# Patient Record
Sex: Female | Born: 1935 | Race: White | Hispanic: No
Health system: Southern US, Community
[De-identification: ages and names within clinical notes are randomized; demographics above are authoritative.]

---

## 2004-02-16 ENCOUNTER — Ambulatory Visit: Payer: Self-pay | Admitting: Family Medicine

## 2004-09-18 ENCOUNTER — Other Ambulatory Visit: Payer: Self-pay

## 2004-10-09 ENCOUNTER — Inpatient Hospital Stay: Payer: Self-pay | Admitting: General Practice

## 2004-10-09 ENCOUNTER — Other Ambulatory Visit: Payer: Self-pay

## 2005-11-22 ENCOUNTER — Ambulatory Visit: Payer: Self-pay | Admitting: Family Medicine

## 2007-01-15 ENCOUNTER — Ambulatory Visit: Payer: Self-pay | Admitting: Family Medicine

## 2009-01-17 ENCOUNTER — Emergency Department: Payer: Self-pay | Admitting: Emergency Medicine

## 2010-04-25 ENCOUNTER — Ambulatory Visit: Payer: Self-pay | Admitting: Family Medicine

## 2011-05-11 ENCOUNTER — Ambulatory Visit: Payer: Self-pay | Admitting: Gastroenterology

## 2011-05-24 ENCOUNTER — Ambulatory Visit: Payer: Self-pay | Admitting: Gastroenterology

## 2011-06-01 ENCOUNTER — Ambulatory Visit: Payer: Self-pay | Admitting: Family Medicine

## 2013-01-09 ENCOUNTER — Emergency Department: Payer: Self-pay | Admitting: Emergency Medicine

## 2013-01-09 LAB — COMPREHENSIVE METABOLIC PANEL
Alkaline Phosphatase: 109 U/L (ref 50–136)
Anion Gap: 5 — ABNORMAL LOW (ref 7–16)
BUN: 18 mg/dL (ref 7–18)
Calcium, Total: 8.7 mg/dL (ref 8.5–10.1)
EGFR (African American): >60
EGFR (Non-African Amer.): 58 — ABNORMAL LOW
Osmolality: 250 (ref 275–301)
Potassium: 4.1 mmol/L (ref 3.5–5.1)
SGOT(AST): 41 U/L — ABNORMAL HIGH (ref 15–37)
SGPT (ALT): 33 U/L (ref 12–78)
Sodium: 122 mmol/L — ABNORMAL LOW (ref 136–145)
Total Protein: 7.2 g/dL (ref 6.4–8.2)

## 2013-01-09 LAB — CBC
HCT: 33 % — ABNORMAL LOW (ref 35.0–47.0)
HGB: 11.6 g/dL — ABNORMAL LOW (ref 12.0–16.0)
MCV: 97 fL (ref 80–100)
WBC: 9.2 10*3/uL (ref 3.6–11.0)

## 2013-01-09 LAB — URINALYSIS, COMPLETE
Bilirubin,UR: NEGATIVE
Blood: NEGATIVE
Glucose,UR: NEGATIVE mg/dL (ref 0–75)
Ketone: NEGATIVE
Nitrite: NEGATIVE
Ph: 7 (ref 4.5–8.0)
RBC,UR: 58 /HPF (ref 0–5)
Specific Gravity: 1.01 (ref 1.003–1.030)
WBC UR: 1003 /HPF (ref 0–5)

## 2013-01-09 LAB — PRO B NATRIURETIC PEPTIDE: B-Type Natriuretic Peptide: 305 pg/mL (ref 0–450)

## 2013-01-09 LAB — LIPASE, BLOOD: Lipase: 148 U/L (ref 73–393)

## 2013-01-09 LAB — TROPONIN I: Troponin-I: <0.02 ng/mL

## 2013-01-09 LAB — CK TOTAL AND CKMB (NOT AT ARMC): CK-MB: 8.6 ng/mL — ABNORMAL HIGH (ref 0.5–3.6)

## 2013-03-05 ENCOUNTER — Ambulatory Visit: Payer: Self-pay | Admitting: Gastroenterology

## 2013-03-05 ENCOUNTER — Encounter: Payer: Self-pay | Admitting: *Deleted

## 2013-03-11 ENCOUNTER — Ambulatory Visit: Payer: Self-pay | Admitting: General Surgery

## 2013-03-17 ENCOUNTER — Ambulatory Visit: Payer: Self-pay | Admitting: General Surgery

## 2013-07-21 ENCOUNTER — Inpatient Hospital Stay: Payer: Self-pay | Admitting: Internal Medicine

## 2013-07-21 LAB — BASIC METABOLIC PANEL
Anion Gap: 8 (ref 7–16)
BUN: 65 mg/dL — ABNORMAL HIGH (ref 7–18)
Calcium, Total: 9.1 mg/dL (ref 8.5–10.1)
Chloride: 103 mmol/L (ref 98–107)
Co2: 27 mmol/L (ref 21–32)
Creatinine: 1.44 mg/dL — ABNORMAL HIGH (ref 0.60–1.30)
EGFR (African American): 36 — ABNORMAL LOW
GFR CALC NON AF AMER: 31 — AB
GLUCOSE: 103 mg/dL — AB (ref 65–99)
Osmolality: 295 (ref 275–301)
POTASSIUM: 4.7 mmol/L (ref 3.5–5.1)
SODIUM: 138 mmol/L (ref 136–145)

## 2013-07-21 LAB — URINALYSIS, COMPLETE
BILIRUBIN, UR: NEGATIVE
Glucose,UR: NEGATIVE mg/dL (ref 0–75)
Hyaline Cast: 2
Ketone: NEGATIVE
NITRITE: NEGATIVE
PROTEIN: NEGATIVE
Ph: 5 (ref 4.5–8.0)
RBC,UR: 3 /HPF (ref 0–5)
SPECIFIC GRAVITY: 1.015 (ref 1.003–1.030)
Squamous Epithelial: 5
WBC UR: 117 /HPF (ref 0–5)

## 2013-07-21 LAB — TROPONIN I: Troponin-I: <0.02 ng/mL

## 2013-07-21 LAB — CBC
HCT: 35.4 % (ref 35.0–47.0)
HGB: 11.6 g/dL — ABNORMAL LOW (ref 12.0–16.0)
MCH: 32.6 pg (ref 26.0–34.0)
MCHC: 32.6 g/dL (ref 32.0–36.0)
MCV: 100 fL (ref 80–100)
Platelet: 245 10*3/uL (ref 150–440)
RBC: 3.54 10*6/uL — ABNORMAL LOW (ref 3.80–5.20)
RDW: 15.8 % — ABNORMAL HIGH (ref 11.5–14.5)
WBC: 10.5 10*3/uL (ref 3.6–11.0)

## 2013-07-21 LAB — PRO B NATRIURETIC PEPTIDE: B-Type Natriuretic Peptide: 134 pg/mL (ref 0–450)

## 2013-07-22 LAB — BASIC METABOLIC PANEL
Anion Gap: 5 — ABNORMAL LOW (ref 7–16)
BUN: 38 mg/dL — ABNORMAL HIGH (ref 7–18)
CO2: 28 mmol/L (ref 21–32)
CREATININE: 1.02 mg/dL (ref 0.60–1.30)
Calcium, Total: 8.8 mg/dL (ref 8.5–10.1)
Chloride: 107 mmol/L (ref 98–107)
EGFR (African American): >60
Glucose: 86 mg/dL (ref 65–99)
OSMOLALITY: 288 (ref 275–301)
Potassium: 3.8 mmol/L (ref 3.5–5.1)
SODIUM: 140 mmol/L (ref 136–145)

## 2013-07-22 LAB — CBC WITH DIFFERENTIAL/PLATELET
BASOS PCT: 0.4 %
Basophil #: 0.1 10*3/uL (ref 0.0–0.1)
Eosinophil #: 0.2 10*3/uL (ref 0.0–0.7)
Eosinophil %: 1.4 %
HCT: 29.1 % — AB (ref 35.0–47.0)
HGB: 9.4 g/dL — ABNORMAL LOW (ref 12.0–16.0)
Lymphocyte #: 0.8 10*3/uL — ABNORMAL LOW (ref 1.0–3.6)
Lymphocyte %: 6.2 %
MCH: 32.5 pg (ref 26.0–34.0)
MCHC: 32.3 g/dL (ref 32.0–36.0)
MCV: 101 fL — AB (ref 80–100)
MONOS PCT: 5.2 %
Monocyte #: 0.7 x10 3/mm (ref 0.2–0.9)
NEUTROS PCT: 86.8 %
Neutrophil #: 11 10*3/uL — ABNORMAL HIGH (ref 1.4–6.5)
Platelet: 198 10*3/uL (ref 150–440)
RBC: 2.89 10*6/uL — ABNORMAL LOW (ref 3.80–5.20)
RDW: 16.1 % — ABNORMAL HIGH (ref 11.5–14.5)
WBC: 12.7 10*3/uL — AB (ref 3.6–11.0)

## 2013-07-24 ENCOUNTER — Ambulatory Visit: Payer: Self-pay | Admitting: Internal Medicine

## 2013-07-24 LAB — CREATININE, SERUM
Creatinine: 0.94 mg/dL (ref 0.60–1.30)
EGFR (African American): >60
EGFR (Non-African Amer.): 52 — ABNORMAL LOW

## 2013-07-24 LAB — CBC WITH DIFFERENTIAL/PLATELET
BASOS PCT: 0.2 %
Basophil #: 0 10*3/uL (ref 0.0–0.1)
EOS ABS: 0 10*3/uL (ref 0.0–0.7)
Eosinophil %: 0.3 %
HCT: 31.5 % — ABNORMAL LOW (ref 35.0–47.0)
HGB: 10.1 g/dL — AB (ref 12.0–16.0)
Lymphocyte #: 1.1 10*3/uL (ref 1.0–3.6)
Lymphocyte %: 8.5 %
MCH: 32 pg (ref 26.0–34.0)
MCHC: 32.2 g/dL (ref 32.0–36.0)
MCV: 100 fL (ref 80–100)
MONO ABS: 1.3 x10 3/mm — AB (ref 0.2–0.9)
Monocyte %: 10.1 %
NEUTROS ABS: 10.5 10*3/uL — AB (ref 1.4–6.5)
NEUTROS PCT: 80.9 %
Platelet: 199 10*3/uL (ref 150–440)
RBC: 3.16 10*6/uL — AB (ref 3.80–5.20)
RDW: 15.3 % — AB (ref 11.5–14.5)
WBC: 13 10*3/uL — AB (ref 3.6–11.0)

## 2013-07-24 LAB — VANCOMYCIN, TROUGH: Vancomycin, Trough: 11 ug/mL (ref 10–20)

## 2013-07-26 LAB — CULTURE, BLOOD (SINGLE)

## 2013-08-24 ENCOUNTER — Ambulatory Visit: Payer: Self-pay | Admitting: Internal Medicine

## 2013-11-09 IMAGING — CT CT ABD-PELV W/ CM
1 of 3 series · 12 of 32 positions shown, 18 images · non-contrast
Comparison: none

REASON FOR EXAM: (1) abd fullness and distention IV contrast ONLY; (2)
abd fullness and distensio
COMMENTS:   May transport without cardiac monitor

[Series 3: soft tissue · axial · 0.64mm/px · z∈[-896,-552]mm · 12 of 137 slices shown, 18 images]
[im 11/137  soft-tissue]
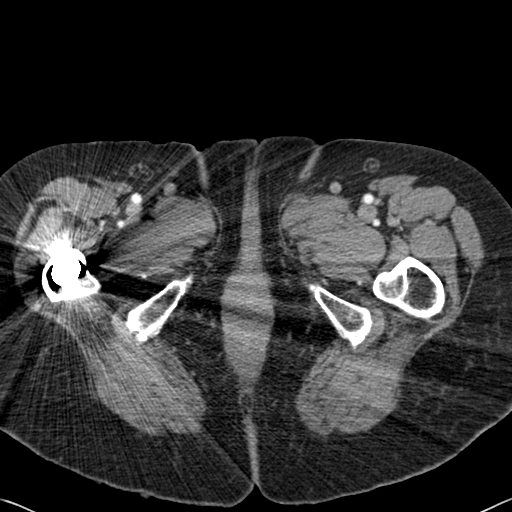
[im 11/137  bone]
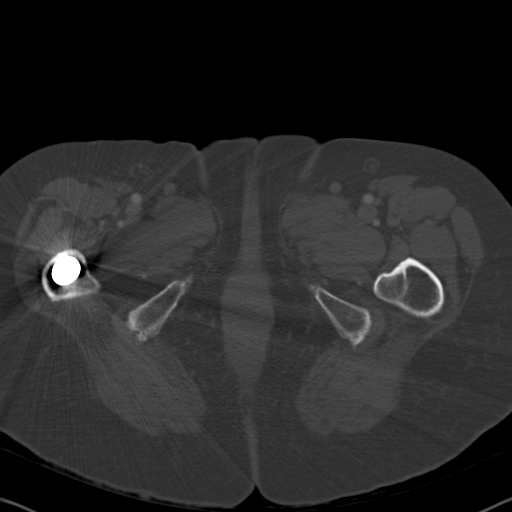
[im 21/137  soft-tissue]
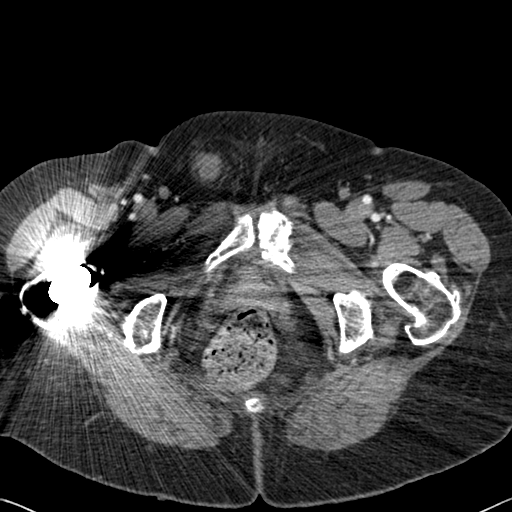
[im 32/137  soft-tissue]
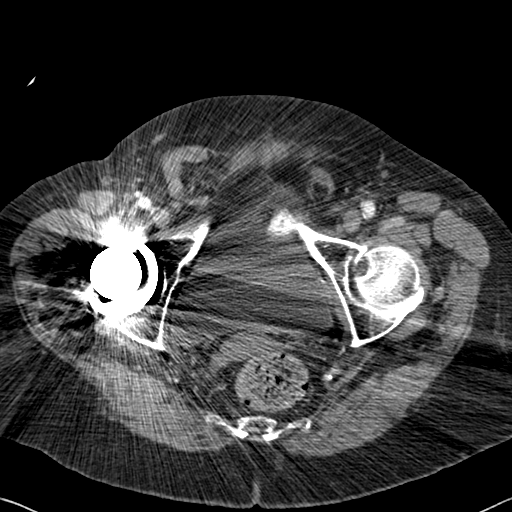
[im 42/137  soft-tissue]
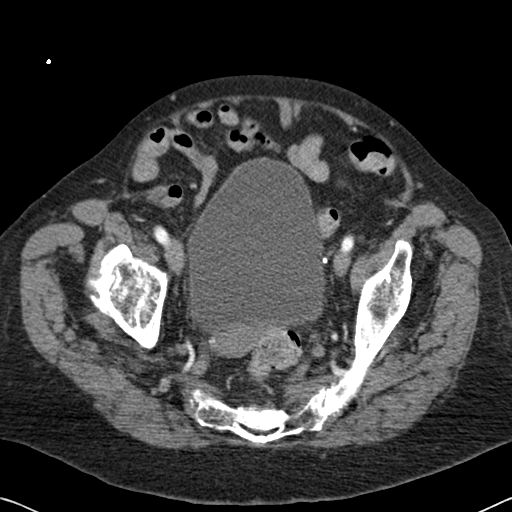
[im 53/137  soft-tissue]
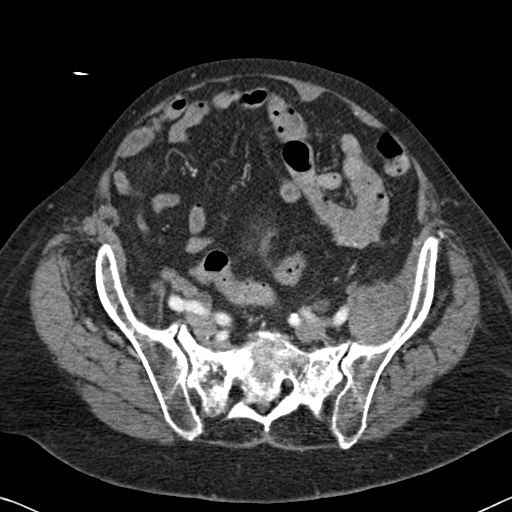
[im 63/137  soft-tissue]
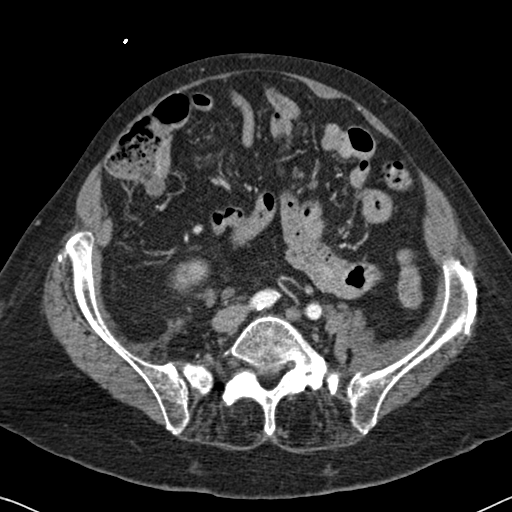
[im 74/137  soft-tissue]
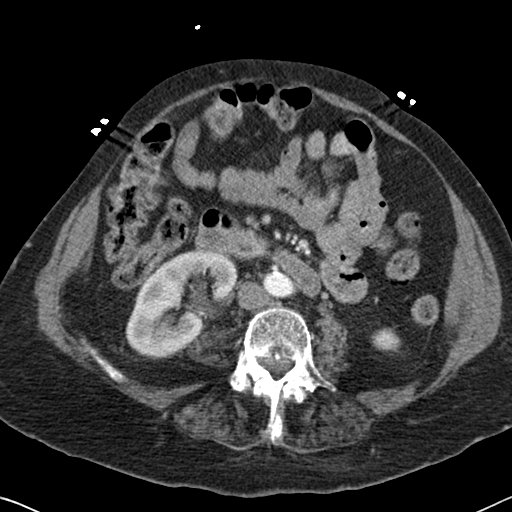
[im 84/137  soft-tissue]
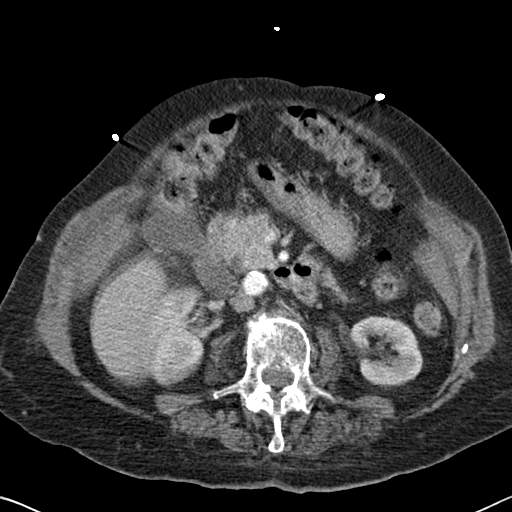
[im 95/137  soft-tissue]
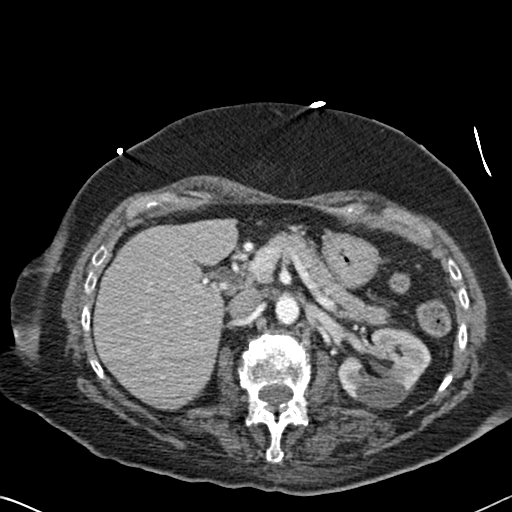
[im 95/137  lung]
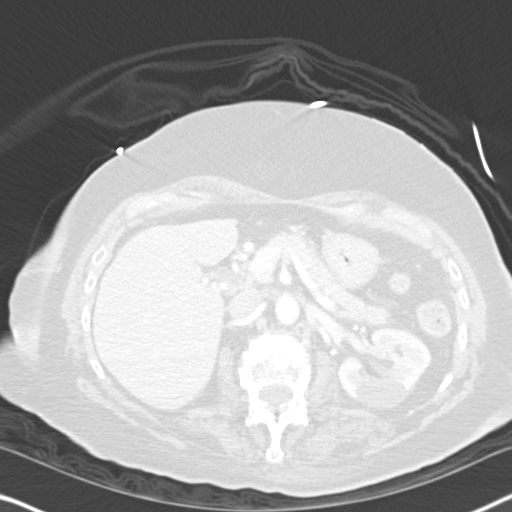
[im 95/137  bone]
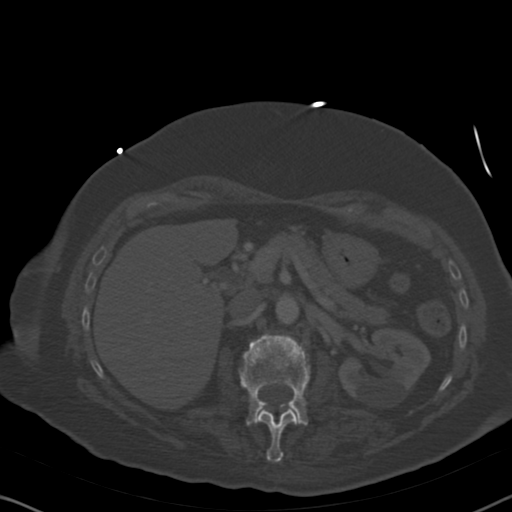
[im 105/137  soft-tissue]
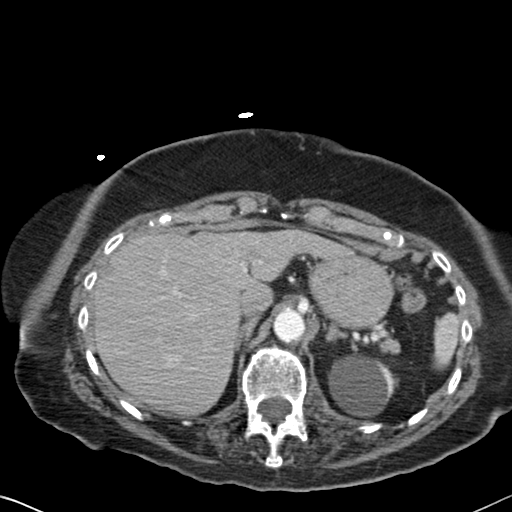
[im 105/137  lung]
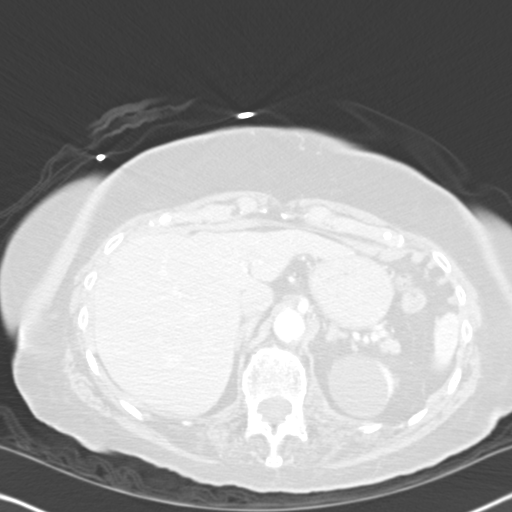
[im 116/137  soft-tissue]
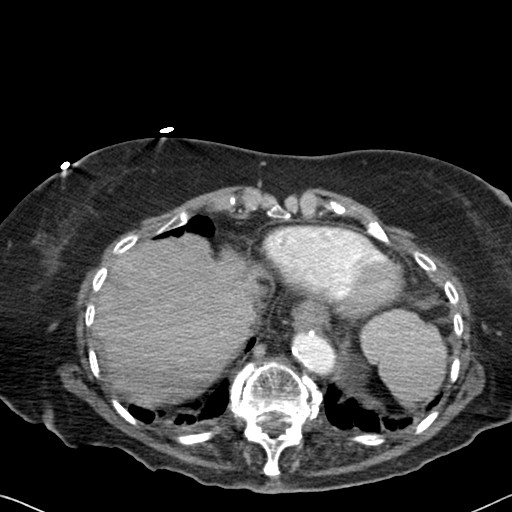
[im 116/137  lung]
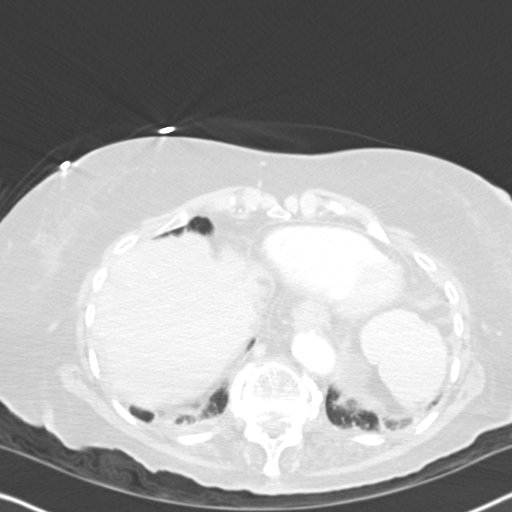
[im 126/137  soft-tissue]
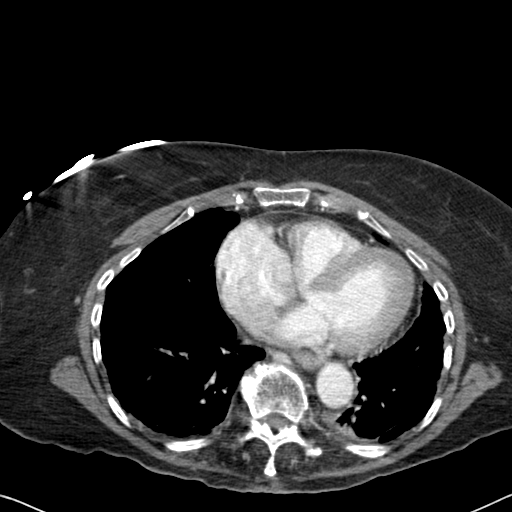
[im 126/137  lung]
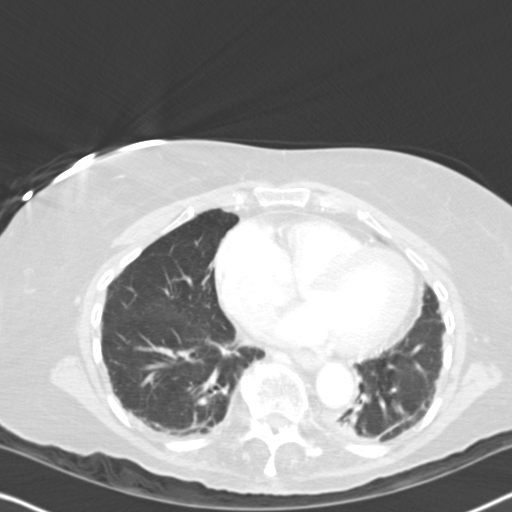

[12 of 32 positions shown; findings below may reference images not displayed]

PROCEDURE:     CT  - CT ABDOMEN / PELVIS  W  - January 09, 2013 [DATE]

RESULT:     CT of the abdomen and pelvis is performed with 100 mL of
Rsovue-U99 iodinated intravenous contrast without benefit of oral contrast.
Images are reconstructed at 3.0 mm slice thickness in the axial plane. There
is no previous similar exam for comparison.

There is artifact from the patient's right hip arthroplasty hardware. There
are areas of linear density in the subpleural region of both lower lobes and
lung base is likely fibrotic or secondary to chronic lung disease with
chronic atelectasis. There is no nodule or mass. There is no pleural or
pericardial effusion. There is a right inguinal hernia containing a
nondistended loop of small bowel. There is deformity of the left pubic
region likely secondary to previous fracture. This does not appear to be
acute. There is a moderate amount of urine in the urinary bladder. There is
no ascites or abnormal fluid collection. The heart is mildly enlarged. The
spleen, liver and pancreas appear unremarkable. The gallbladder shows no
definite radiopaque stones. There are cysts in the left kidney upper and
midpole regions. No definite right renal solid or cystic mass is present.
The adrenal glands are unremarkable. There is a small hiatal hernia. There
is normal caliber of the abdominal aorta. The colon and small bowel appear
unremarkable. The stomach is nondistended. Multilevel degenerative changes
are seen in the lumbar spine with extensive degenerative disc narrowing.
There is T11 severe compression deformity with some retropulsion of bony
material into the spinal canal causing at least 50% narrowing on the
sagittal reconstructions. This is likely chronic.
IMPRESSION: 1. Left renal cysts.
2. Mild cardiomegaly.
3. Compression fracture of T11 which is chronic.
4. Right inguinal hernia containing nondistended loops of small bowel. No
bowel obstruction evident. There is fat and a left inguinal hernia.

[REDACTED]

## 2013-12-24 DEATH — deceased

## 2014-05-23 IMAGING — CR DG ABDOMEN 1V
1 series · 2 of 2 positions shown · non-contrast
Comparison: CT of the abdomen and pelvis performed 01/09/2013

CLINICAL DATA: Abdominal distention and pain.  Vomiting.

EXAM:
ABDOMEN - 1 VIEW

[Series 1: supine kub · 0.17mm/px · 2 of 2 slices shown]
[im 1/2]
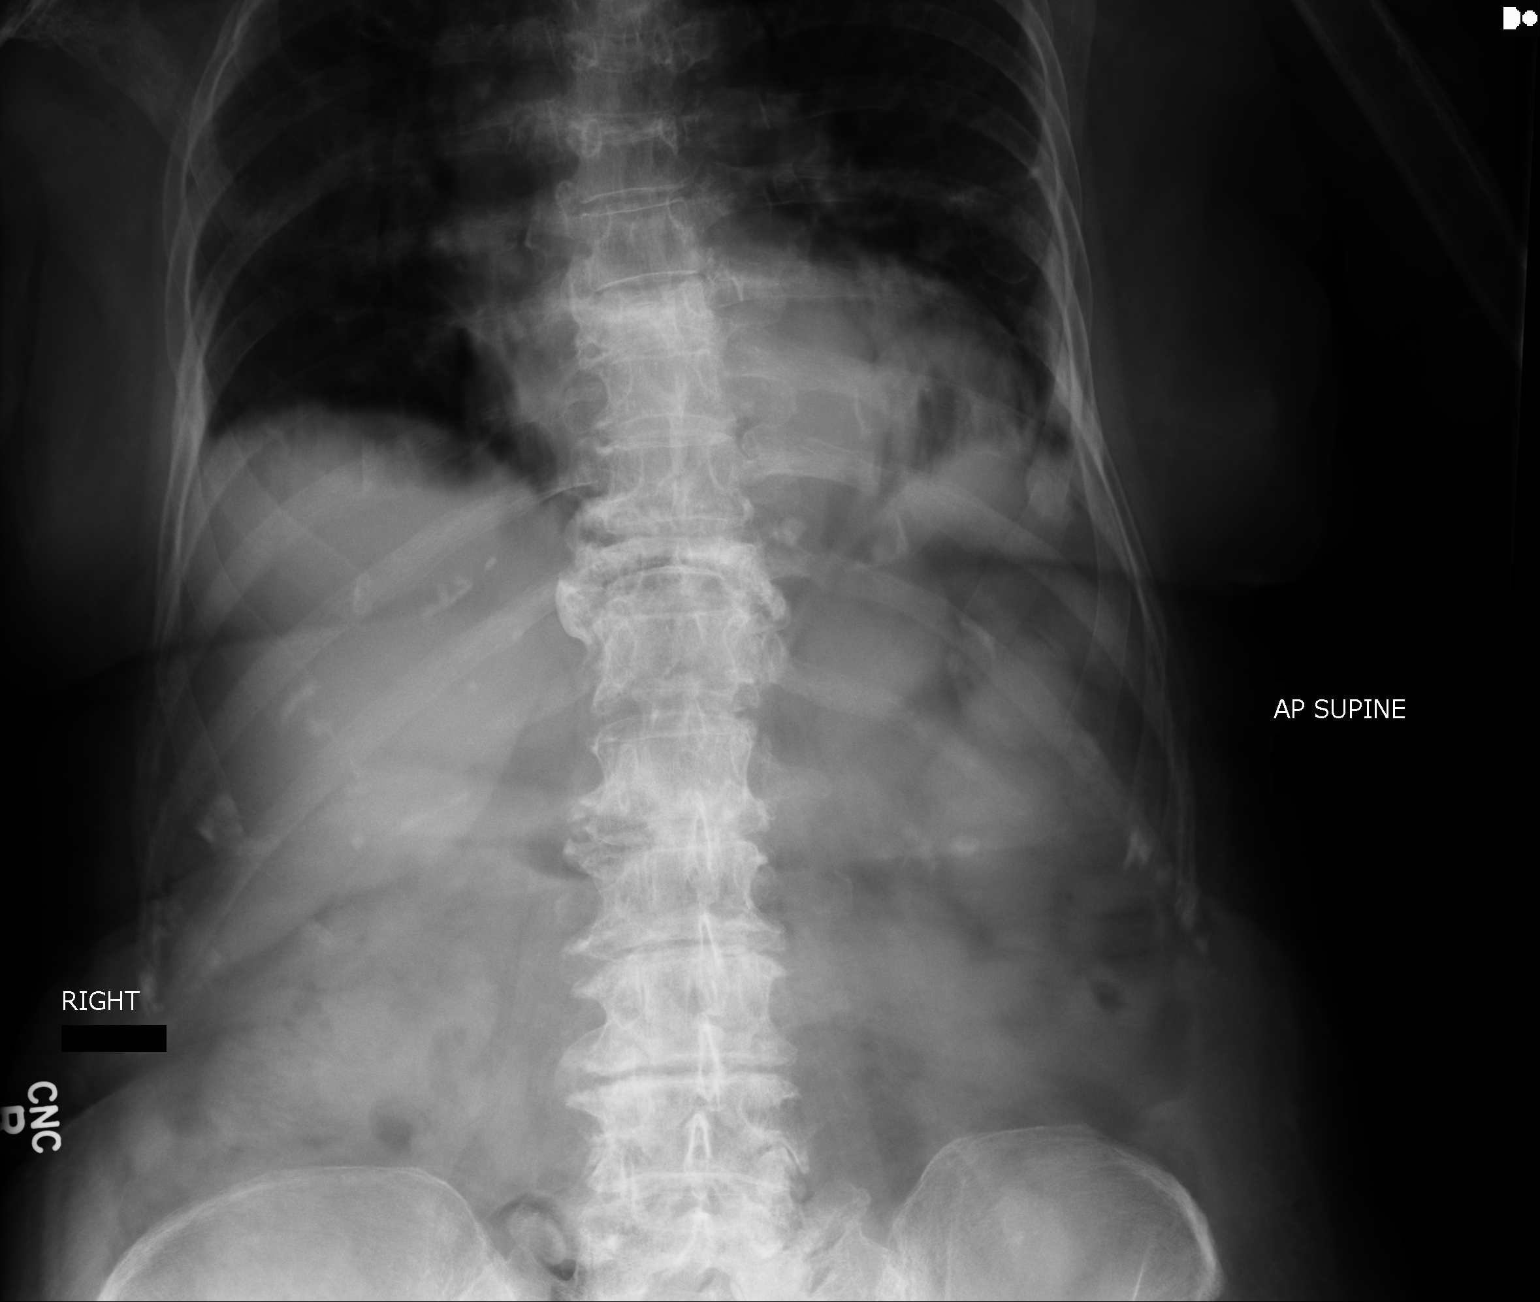
[im 2/2]
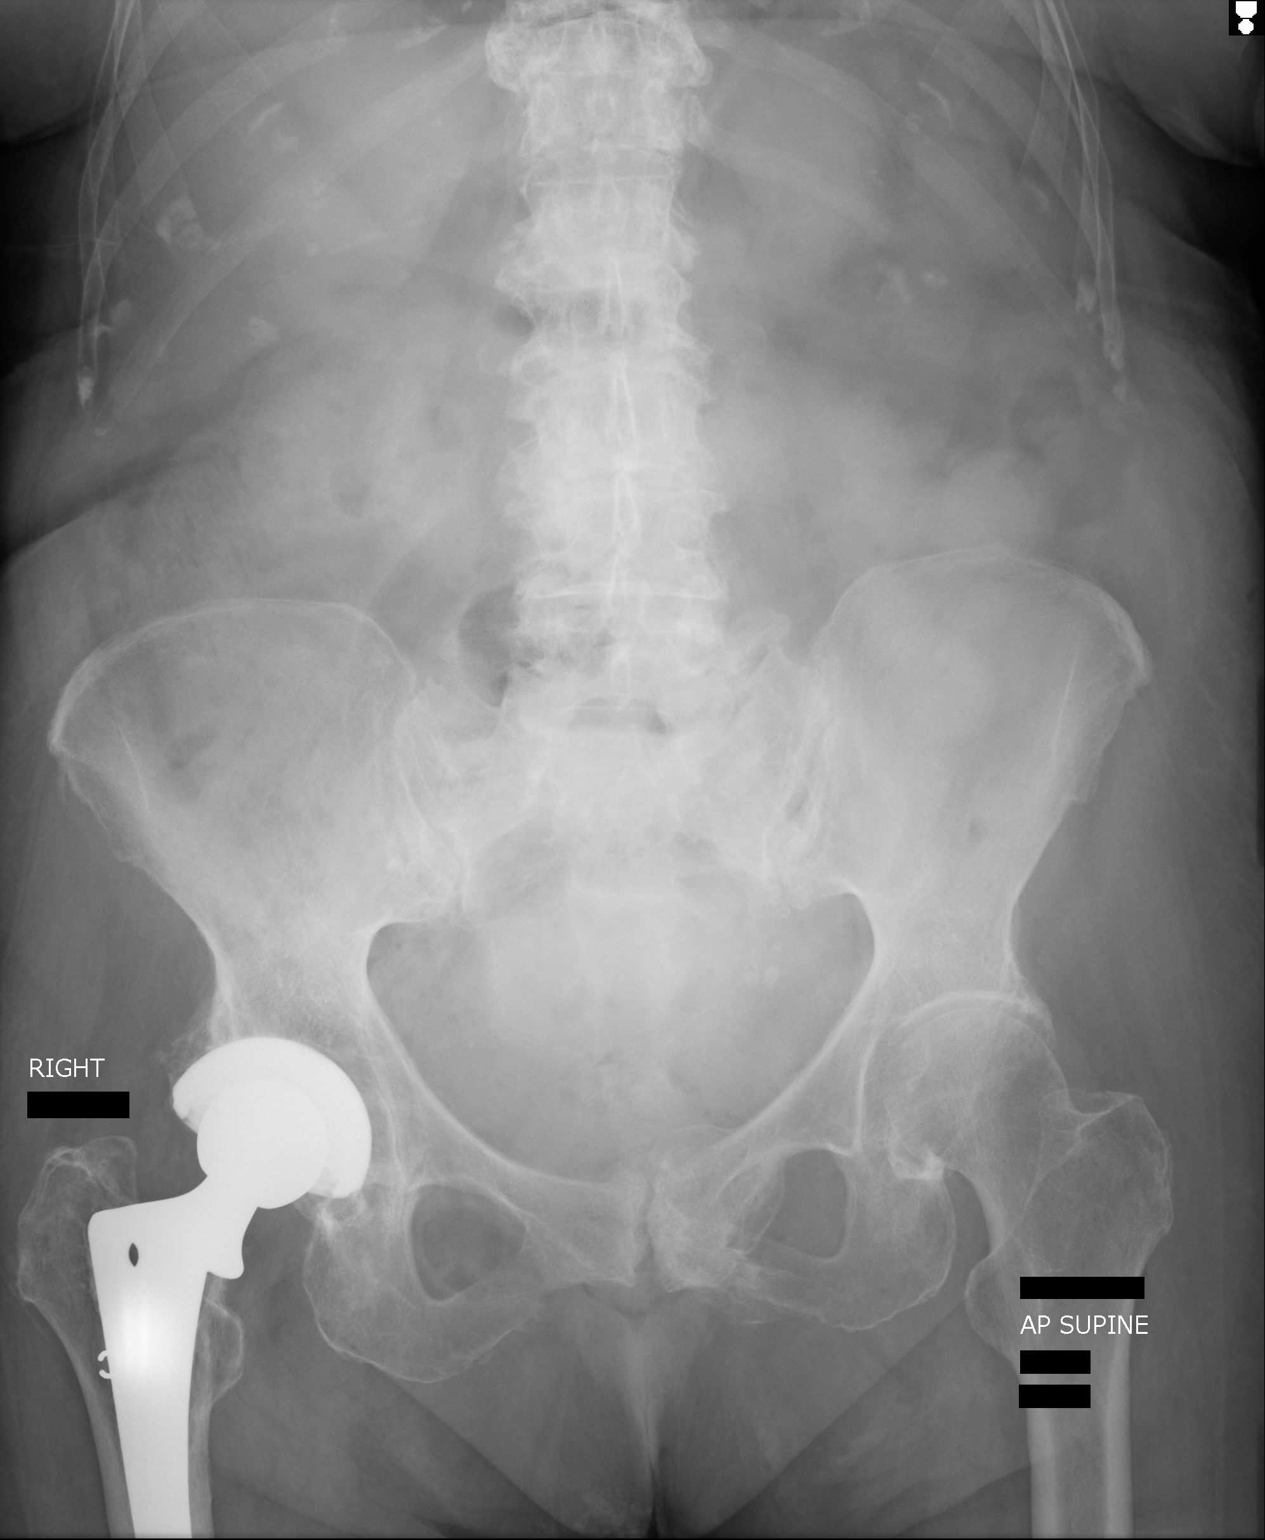

[2 of 2 positions shown; findings below may reference images not displayed]

FINDINGS: The visualized bowel gas pattern is unremarkable. Scattered air and
stool filled loops of colon are seen; no abnormal dilatation of
small bowel loops is seen to suggest small bowel obstruction. No
free intra-abdominal air is identified, though evaluation for free
air is limited on a single supine view.

The right hip arthroplasty appears grossly intact, although
incompletely imaged; the sacroiliac joints are unremarkable in
appearance. Mild degenerative change is noted along the lumbar
spine. There is chronic deformity involving the left superior and
inferior pubic rami. A chronic compression deformity is again noted
at the lower thoracic spine. Mild left basilar airspace opacity
raises concern for pneumonia.
IMPRESSION: 1. Unremarkable bowel gas pattern; no free intra-abdominal air seen.
Moderate amount of stool noted in the colon.
2. Mild left basilar airspace opacity raises concern for pneumonia.

## 2014-07-17 NOTE — Discharge Summary (Signed)
PATIENT NAME:  Joyce Sanchez, Joyce Sanchez MR#:  [id number] DATE OF BIRTH:  1918/02/01  DATE OF ADMISSION:  07/21/2013 DATE OF DISCHARGE:  07/25/2013  PRESENTING COMPLAINT: Cough and shortness of breath.   DISCHARGE DIAGNOSES: 1. Altered mental status, metabolic encephalopathy secondary to urinary tract infection/pneumonia.  2. Aspiration pneumonia.  3. Urinary tract infection.  4. Adult failure to thrive.  5. Dementia.  6. Hyperlipidemia.  7. Hypothyroidism.   CODE STATUS: No code, DO NOT RESUSCITATE.   MEDICATIONS: 1. Delsym 10 mL b.i.d. as needed.  2. Senna-S 1 tablet daily as needed for constipation.  3. Tylenol 650 mg q. 4 hours p.r.n.  4. Vitamin D3 1000 international units p.o. daily.  5. Aspirin 81 mg daily.  6. K-Dur 10 mEq p.o. daily.  7. Levothyroxine 88 mcg p.o. daily.  8. MiraLAX 17 g daily as needed.   9. Amlodipine 5 mg daily.  10. Citalopram 20 mg daily at bedtime.  11. Simvastatin 10 mg at bedtime.  12. Olanzapine 5 mg daily.  13. Protonix 40 mg b.i.d.  14. Carafate 1 gram b.i.d.  15. Trazodone 50 mg at bedtime.  16. Xanax 0.25 b.i.d. as needed.  17. Zofran 4 mg q. 6 hours p.r.n. (Dictation Anomaly) 18. Zofran 4 mg every 8 hours. (Dictation Anomaly) 19. Losartan 50 mg daily.  20. Levaquin 500 mg for 7 days.   DIET: Pureed, honey-thick, strict aspiration precautions. Medications in puree, crush, moisten food with condiments. The patient will need assistance with feeding.   FOLLOWUP:  With Dr. Burnett ShengHedrick in 1 to 2 weeks.  [facility].   Palliative care consultation with Dr. Harvie JuniorPhifer.   [facility]   BRIEF SUMMARY OF HOSPITAL COURSE: Joyce Sanchez is a 81 year old Caucasian female with past medical history of hypertension, was sent in from skilled nursing facility with cough, shortness of breath and possible aspiration. She was admitted with:  1. Acute encephalopathy, which is suspected due to UTI,  pneumonia, with underlying dementia. She appears to be at baseline.  2. Acute respiratory failure with bilateral pneumonia, likely aspiration. She is continued on oxygen supplementation and p.r.n. DuoNebs. The patient was placed on vancomycin, Zosyn and Levaquin; now changed to p.o. Levaquin. Blood cultures are negative. The patient was seen by speech, started on pureed, nectar-thick liquid diet, but not eating much.  3. UTI. Complete Levaquin treatment.  4. Acute renal failure, resolved with IV fluids.  5. Anemia, chronic and stable.  6. Constipation, resolved.  7. Failure to thrive with advanced dementia.   The patient was seen by palliative care. She is a DO NOT RESUSCITATE. She was evaluated by hospice and will be [facility]. Creatinine at discharge is 0.94. White count is 13, hemoglobin and hematocrit is 10.1 and 30.1. Potassium is 3.8. Blood cultures are negative.   TIME SPENT: 40 minutes.   ____________________________ Wylie HailSona A. Allena KatzPatel, MD sap:lm D: 07/25/2013 10:48:00 ET T: 07/25/2013 19:54:27 ET JOB#: [id number]  cc: Elih Mooney A. Allena KatzPatel, MD, <Dictator> Willow OraSONA A Mariem Skolnick MD ELECTRONICALLY SIGNED 08/16/2013 15:41

## 2014-07-17 NOTE — H&P (Signed)
PATIENT NAME:  Joyce Sanchez, Joyce Sanchez MR#:  [id number] DATE OF BIRTH:  [birth year]  DATE OF ADMISSION:  07/21/2013  REFERRING PHYSICIAN: Andrey FarmerJulie E. Lavella LemonsManly, MD  PRIMARY CARE PHYSICIAN: Rhona LeavensJames F. Burnett ShengHedrick, MD  CHIEF COMPLAINT: Cough, shortness of breath.   HISTORY OF PRESENT ILLNESS: This is a 81 year old female with significant past medical history of hyperlipidemia, hypertension, hypothyroidism, who is a nursing home resident. The patient is sent from nursing home for complaints of shortness of breath and cough as well as, per nursing home, there appears to be possible aspiration as well. The patient has been complaining of cough for some time where she had x-ray at the nursing home that did not show any pneumonia. The patient has been on p.o. clindamycin for some time at the nursing home. Upon presentation from the nursing home, the patient was saturating 88% on room air, per EMS. The patient's chest x-ray showing significant right lower lobe infiltrate, as well multifocal opacities, which is suspicious for multifocal pneumonia. The patient was afebrile with no leukocytosis here. As well, the patient's urinalysis was positive for UTI. The patient was confused, unclear what is her baseline from nursing home documentation.   PAST MEDICAL HISTORY:  1. Hyperlipidemia.  2. Hypertension.  3. Hypothyroidism.  4. Macular degeneration. 5. Mild osteoporosis.  6. Right frozen shoulder.    PAST SURGICAL HISTORY:  1. Hemorrhoid surgery.  2. Tonsillectomy.  3. Foot surgery.  4. Right hip replacement in 2006.   SOCIAL HISTORY: No history of smoking, alcohol or illicit drug use, as per medical records.   FAMILY HISTORY: Significant for father having heart disease, mother having hypertension, brother having lung cancer, a sister having osteoporosis.   ALLERGIES: CODEINE, DEMEROL, PENICILLIN, TRAMADOL.   HOME MEDICATIONS:  1. Aspirin 81 mg daily.  2. Tylenol as needed.  3. Losartan 100 mg oral daily.  4.  Citalopram 20 mg oral at bedtime.  5. Crestor 50 mg oral daily.  6. Zofran as needed.  7. Simvastatin 10 mg at bedtime.  8. Olanzapine 5 mg oral daily.  9. Delsym as needed for cough.  10. Xanax 0.5 mg 2 times a day as needed for anxiety.  11. Amlodipine 5 mg oral daily.  12. Lasix 20 mg daily.  13. MiraLax as needed.  14. Senna as needed.  15. Potassium 10 mEq oral daily.  16. Carafate 1 gram oral 2 times a day.  17. Protonix 40 mg oral b.i.d.  18. Levothyroxine 88 mcg oral daily.  19. Vitamin D3 one tab international units daily.   REVIEW OF SYSTEMS: The patient is confused, cannot give any reliable review of systems, but she has been complaining of cough for a few days.   PHYSICAL EXAMINATION:  VITAL SIGNS: Temperature 97.5, pulse 62, respiratory rate 22, blood pressure 117/50, saturating 96% on room air.  GENERAL: Frail, elderly female lying in bed in no apparent distress.  HEENT: Head atraumatic, normocephalic. Pupils equally reactive to light. Pink conjunctivae. Anicteric sclerae. Dry oral mucosa.  NECK: Supple. No thyromegaly. No JVD.  CHEST: The patient had fair air entry bilaterally with scattered rales and rhonchi. No wheezing.  CARDIOVASCULAR: S1 and S2 heard. No rubs, murmur or gallops.  ABDOMEN: Soft, nontender, nondistended. Bowel sounds present.  EXTREMITIES: No edema. No clubbing. No cyanosis.  SKIN: Dry, decreased skin turgor.  MUSCULOSKELETAL: No joint effusion or erythema.  PSYCHIATRIC: The patient is awake, alert, communicative, pleasant, but confused.  NEUROLOGIC: Cranial nerves grossly intact. Appears to be moving all extremities  without significant deficits.   PERTINENT LABORATORY DATA: Glucose 103, BNP 134, BUN 55, creatinine 1.44, sodium 138, potassium 4.7, chloride 103, CO2 27. Troponin less than 0.02. White blood cells 10.5, hemoglobin 11.6, hematocrit 35.4, platelets 245. Urinalysis +3 leukocyte esterase and 117 white blood cells.   IMAGING: Chest x-ray:  Focal right lower lobe airspace opacification and more mild patchy bilateral airspace opacities concerning for multifocal pneumonia, small right pleural effusion noted and mild cardiomegaly.   ASSESSMENT AND PLAN:  1. Pneumonia. There is a suspicion of aspiration pneumonia. The patient will be kept on vancomycin and levofloxacin. Will consult speech and swallow service in the morning to evaluate for possible aspiration. Meanwhile, the patient will be kept n.p.o. except p.o. medications.  2. Urinary tract infection. The patient is on levofloxacin. Will follow on the urine culture.  3. Altered mental status. This is most likely due to her pneumonia and urinary tract infection.  4. Hyperlipidemia. Continue with statin.  5. Hypertension. Blood pressure acceptable. Continue with home medication.  6. Hypothyroidism. Continue with Synthroid.   CODE STATUS: The patient has DNR form from nursing home. The patient will be kept DNR.   TOTAL TIME SPENT ON ADMISSION AND PATIENT CARE: 50 minutes.   ____________________________ Starleen Arms, MD dse:lb D: 07/21/2013 05:15:04 ET T: 07/21/2013 05:41:18 ET JOB#: [id number]  cc: Starleen Arms, MD, <Dictator> Kameria Canizares Teena Irani MD ELECTRONICALLY SIGNED 07/21/2013 23:43
